# Patient Record
Sex: Female | Born: 1975 | Race: White | Hispanic: No | Marital: Married | Smoking: Never smoker
Health system: Southern US, Community
[De-identification: ages and names within clinical notes are randomized; demographics above are authoritative.]

## PROBLEM LIST (undated history)

## (undated) DIAGNOSIS — M150 Primary generalized (osteo)arthritis: Secondary | ICD-10-CM

## (undated) HISTORY — DX: Primary generalized (osteo)arthritis: M15.0

---

## 2009-12-11 ENCOUNTER — Emergency Department (HOSPITAL_COMMUNITY): Admission: EM | Admit: 2009-12-11 | Discharge: 2009-12-11 | Payer: Self-pay | Admitting: Family Medicine

## 2012-01-29 ENCOUNTER — Other Ambulatory Visit (HOSPITAL_COMMUNITY)
Admission: RE | Admit: 2012-01-29 | Discharge: 2012-01-29 | Disposition: A | Discharge status: Home / Self Care | Payer: Self-pay | Source: Ambulatory Visit | Attending: Family Medicine | Admitting: Family Medicine

## 2012-01-29 DIAGNOSIS — Z01419 Encounter for gynecological examination (general) (routine) without abnormal findings: Secondary | ICD-10-CM | POA: Insufficient documentation

## 2013-02-03 ENCOUNTER — Ambulatory Visit (INDEPENDENT_AMBULATORY_CARE_PROVIDER_SITE_OTHER): Payer: Commercial Managed Care - PPO | Admitting: Physician Assistant

## 2013-02-03 ENCOUNTER — Encounter: Payer: Self-pay | Admitting: Physician Assistant

## 2013-02-03 VITALS — BP 110/70 | HR 78 | Temp 97.3°F | Resp 18 | Ht 63.0 in | Wt 149.0 lb

## 2013-02-03 DIAGNOSIS — M25561 Pain in right knee: Secondary | ICD-10-CM

## 2013-02-03 DIAGNOSIS — M25569 Pain in unspecified knee: Secondary | ICD-10-CM
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
## 2014-01-30 ENCOUNTER — Ambulatory Visit
Admission: RE | Admit: 2014-01-30 | Discharge: 2014-01-30 | Disposition: A | Discharge status: Home / Self Care | Payer: Commercial Managed Care - PPO | Source: Ambulatory Visit | Attending: Family Medicine | Admitting: Family Medicine

## 2014-01-30 ENCOUNTER — Encounter: Payer: Self-pay | Admitting: Family Medicine

## 2014-01-30 ENCOUNTER — Ambulatory Visit (INDEPENDENT_AMBULATORY_CARE_PROVIDER_SITE_OTHER): Payer: Commercial Managed Care - PPO | Admitting: Family Medicine

## 2014-01-30 VITALS — BP 110/68 | HR 80 | Temp 98.5°F | Resp 16 | Ht 64.0 in | Wt 151.0 lb

## 2014-01-30 DIAGNOSIS — M79609 Pain in unspecified limb: Secondary | ICD-10-CM | POA: Diagnosis not present

## 2014-01-30 DIAGNOSIS — M79641 Pain in right hand: Secondary | ICD-10-CM
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
## 2014-02-01 ENCOUNTER — Other Ambulatory Visit: Payer: Self-pay | Admitting: *Deleted

## 2014-02-16 ENCOUNTER — Ambulatory Visit: Payer: Self-pay | Admitting: Family Medicine

## 2015-09-13 ENCOUNTER — Emergency Department (HOSPITAL_COMMUNITY)
Admission: EM | Admit: 2015-09-13 | Discharge: 2015-09-13 | Disposition: A | Discharge status: Home / Self Care | Payer: Commercial Managed Care - PPO | Attending: Emergency Medicine | Admitting: Emergency Medicine

## 2015-09-13 ENCOUNTER — Emergency Department (HOSPITAL_COMMUNITY): Payer: Commercial Managed Care - PPO

## 2015-09-13 ENCOUNTER — Other Ambulatory Visit (HOSPITAL_BASED_OUTPATIENT_CLINIC_OR_DEPARTMENT_OTHER): Payer: Self-pay | Admitting: Emergency Medicine

## 2015-09-13 ENCOUNTER — Encounter (HOSPITAL_COMMUNITY): Payer: Self-pay | Admitting: *Deleted

## 2015-09-13 ENCOUNTER — Ambulatory Visit (HOSPITAL_BASED_OUTPATIENT_CLINIC_OR_DEPARTMENT_OTHER)
Admission: RE | Admit: 2015-09-13 | Discharge: 2015-09-13 | Disposition: A | Discharge status: Home / Self Care | Payer: Commercial Managed Care - PPO | Source: Ambulatory Visit | Attending: Emergency Medicine | Admitting: Emergency Medicine

## 2015-09-13 DIAGNOSIS — Z880 Allergy status to penicillin: Secondary | ICD-10-CM | POA: Diagnosis not present

## 2015-09-13 DIAGNOSIS — M5030 Other cervical disc degeneration, unspecified cervical region: Secondary | ICD-10-CM | POA: Insufficient documentation

## 2015-09-13 DIAGNOSIS — Y9389 Activity, other specified: Secondary | ICD-10-CM | POA: Insufficient documentation

## 2015-09-13 DIAGNOSIS — S161XXA Strain of muscle, fascia and tendon at neck level, initial encounter: Secondary | ICD-10-CM

## 2015-09-13 DIAGNOSIS — X58XXXA Exposure to other specified factors, initial encounter: Secondary | ICD-10-CM | POA: Insufficient documentation

## 2015-09-13 DIAGNOSIS — S199XXA Unspecified injury of neck, initial encounter: Secondary | ICD-10-CM

## 2015-09-13 DIAGNOSIS — Y9375 Activity, martial arts: Secondary | ICD-10-CM

## 2015-09-13 DIAGNOSIS — Y998 Other external cause status: Secondary | ICD-10-CM | POA: Diagnosis not present

## 2015-09-13 DIAGNOSIS — Y9289 Other specified places as the place of occurrence of the external cause: Secondary | ICD-10-CM | POA: Insufficient documentation

## 2015-09-13 DIAGNOSIS — W500XXA Accidental hit or strike by another person, initial encounter: Secondary | ICD-10-CM | POA: Diagnosis not present
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
## 2015-09-13 NOTE — ED Notes (Signed)
Ptient able to ambulate independently
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
## 2015-09-13 NOTE — Discharge Instructions (Signed)
Using take the Vicodin and Valium you were given today at urgent care for pain control, do not drive when taking these, they will make you drowsy would be the equivalent of driving drunk.  Please follow with your primary care doctor in the next 2 days for a check-up. They must obtain records for further management.   Do not hesitate to return to the Emergency Department for any new, worsening or concerning symptoms.    Cervical Sprain A cervical sprain is an injury in the neck in which the strong, fibrous tissues (ligaments) that connect your neck bones stretch or tear. Cervical sprains can range from mild to severe. Severe cervical sprains can cause the neck vertebrae to be unstable. This can lead to damage of the spinal cord and can result in serious nervous system problems. The amount of time it takes for a cervical sprain to get better depends on the cause and extent of the injury. Most cervical sprains heal in 1 to 3 weeks. CAUSES  Severe cervical sprains may be caused by:   Contact sport injuries (such as from football, rugby, wrestling, hockey, auto racing, gymnastics, diving, martial arts, or boxing).   Motor vehicle collisions.   Whiplash injuries. This is an injury from a sudden forward and backward whipping movement of the head and neck.  Falls.  Mild cervical sprains may be caused by:   Being in an awkward position, such as while cradling a telephone between your ear and shoulder.   Sitting in a chair that does not offer proper support.   Working at a poorly Landscape architect station.   Looking up or down for long periods of time.  SYMPTOMS   Pain, soreness, stiffness, or a burning sensation in the front, back, or sides of the neck. This discomfort may develop immediately after the injury or slowly, 24 hours or more after the injury.   Pain or tenderness directly in the middle of the back of the neck.   Shoulder or upper back pain.   Limited ability to move  the neck.   Headache.   Dizziness.   Weakness, numbness, or tingling in the hands or arms.   Muscle spasms.   Difficulty swallowing or chewing.   Tenderness and swelling of the neck.  DIAGNOSIS  Most of the time your health care provider can diagnose a cervical sprain by taking your history and doing a physical exam. Your health care provider will ask about previous neck injuries and any known neck problems, such as arthritis in the neck. X-rays may be taken to find out if there are any other problems, such as with the bones of the neck. Other tests, such as a CT scan or MRI, may also be needed.  TREATMENT  Treatment depends on the severity of the cervical sprain. Mild sprains can be treated with rest, keeping the neck in place (immobilization), and pain medicines. Severe cervical sprains are immediately immobilized. Further treatment is done to help with pain, muscle spasms, and other symptoms and may include:  Medicines, such as pain relievers, numbing medicines, or muscle relaxants.   Physical therapy. This may involve stretching exercises, strengthening exercises, and posture training. Exercises and improved posture can help stabilize the neck, strengthen muscles, and help stop symptoms from returning.  HOME CARE INSTRUCTIONS   Put ice on the injured area.   Put ice in a plastic bag.   Place a towel between your skin and the bag.   Leave the ice on for 15-20  minutes, 3-4 times a day.   If your injury was severe, you may have been given a cervical collar to wear. A cervical collar is a two-piece collar designed to keep your neck from moving while it heals.  Do not remove the collar unless instructed by your health care provider.  If you have long hair, keep it outside of the collar.  Ask your health care provider before making any adjustments to your collar. Minor adjustments may be required over time to improve comfort and reduce pressure on your chin or on the  back of your head.  Ifyou are allowed to remove the collar for cleaning or bathing, follow your health care provider's instructions on how to do so safely.  Keep your collar clean by wiping it with mild soap and water and drying it completely. If the collar you have been given includes removable pads, remove them every 1-2 days and hand wash them with soap and water. Allow them to air dry. They should be completely dry before you wear them in the collar.  If you are allowed to remove the collar for cleaning and bathing, wash and dry the skin of your neck. Check your skin for irritation or sores. If you see any, tell your health care provider.  Do not drive while wearing the collar.   Only take over-the-counter or prescription medicines for pain, discomfort, or fever as directed by your health care provider.   Keep all follow-up appointments as directed by your health care provider.   Keep all physical therapy appointments as directed by your health care provider.   Make any needed adjustments to your workstation to promote good posture.   Avoid positions and activities that make your symptoms worse.   Warm up and stretch before being active to help prevent problems.  SEEK MEDICAL CARE IF:   Your pain is not controlled with medicine.   You are unable to decrease your pain medicine over time as planned.   Your activity level is not improving as expected.  SEEK IMMEDIATE MEDICAL CARE IF:   You develop any bleeding.  You develop stomach upset.  You have signs of an allergic reaction to your medicine.   Your symptoms get worse.   You develop new, unexplained symptoms.   You have numbness, tingling, weakness, or paralysis in any part of your body.  MAKE SURE YOU:   Understand these instructions.  Will watch your condition.  Will get help right away if you are not doing well or get worse.   This information is not intended to replace advice given to you by  your health care provider. Make sure you discuss any questions you have with your health care provider.   Document Released: 06/28/2007 Document Revised: 09/05/2013 Document Reviewed: 03/08/2013 Elsevier Interactive Patient Education [employer].

## 2015-09-13 NOTE — ED Notes (Signed)
Pt continues to be in MRI

## 2015-09-13 NOTE — ED Notes (Signed)
Pt discussed  With dr Eulis Foster  He wants the pt to be seen beofre a mri is ordered.  This information was given to the pt and her husband

## 2015-10-29 ENCOUNTER — Encounter: Payer: Self-pay | Admitting: Family Medicine

## 2015-10-29 ENCOUNTER — Encounter: Payer: Self-pay | Admitting: Physician Assistant

## 2015-10-29 ENCOUNTER — Ambulatory Visit (INDEPENDENT_AMBULATORY_CARE_PROVIDER_SITE_OTHER): Payer: Commercial Managed Care - PPO | Admitting: Family Medicine

## 2015-10-29 VITALS — BP 112/68 | HR 70 | Temp 98.2°F | Resp 14 | Ht 64.0 in | Wt 160.0 lb

## 2015-10-29 DIAGNOSIS — J0110 Acute frontal sinusitis, unspecified: Secondary | ICD-10-CM | POA: Diagnosis not present

## 2015-10-29 DIAGNOSIS — B349 Viral infection, unspecified: Secondary | ICD-10-CM | POA: Diagnosis not present

## 2015-10-29 MED ORDER — GUAIFENESIN-CODEINE 100-10 MG/5ML PO SOLN
5.0000 mL | Freq: Four times a day (QID) | ORAL | Status: DC | PRN
Start: 2015-10-29 — End: 2016-01-27

## 2015-10-29 MED ORDER — AZITHROMYCIN 250 MG PO TABS: ORAL_TABLET | ORAL | Status: DC

## 2015-10-29 NOTE — Progress Notes (Signed)
Patient ID: Carrie Baird, female   DOB: 1975/12/28, 40 y.o.   MRN: [id number]   Subjective:    Patient ID: Carrie Baird, female    DOB: 06/24/1976, 40 y.o.   MRN: [id number]  Patient presents for Illness  patient here with sinus pressure drainage cough sore throat she lost her voice this morning. This has been going on for the past few days. Subjective chills. No fever. No nausea vomiting or GI symptoms. 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 She is on the road and flies a lot she is leaving to fly to another hospital this weekend. Taking OTC allergra, nyquil   Review Of Systems:  GEN- + fatigue, fever, weight loss,weakness, recent illness HEENT- denies eye drainage, change in vision,+ nasal discharge, CVS- denies chest pain, palpitations RESP- denies SOB, +cough, wheeze ABD- denies N/V, change in stools, abd pain GU- denies dysuria, hematuria, dribbling, incontinence MSK- denies joint pain, muscle aches, injury Neuro- + headache, dizziness, syncope, seizure activity       Objective:    BP 112/68 mmHg  Pulse 70  Temp(Src) 98.2 F (36.8 C) (Oral)  Resp 14  Ht 5' 4"  (1.626 m)  Wt 160 lb (72.576 kg)  BMI 27.45 kg/m2  LMP 10/21/2015 (Approximate) GEN- NAD, alert and oriented x3 HEENT- PERRL, EOMI, non injected sclera, pink conjunctiva, MMM, oropharynx mild injection, TM clear bilat no effusion,  + frontal sinus tenderness, inflammed turbinates,  Nasal drainage  Neck- Supple, no LAD CVS- RRR, no murmur RESP-CTAB EXT- No edema Pulses- Radial 2+          Assessment & Plan:      Problem List Items Addressed This Visit    None    Visit Diagnoses    Viral illness    -  Primary    Currently viral illness, given robitussin AC, continue allegra, given zpak if she does not improve by weekend before she flys out    Relevant Medications    azithromycin (ZITHROMAX) 250 MG tablet     Acute frontal sinusitis, recurrence not specified        Relevant Medications    guaiFENesin-codeine 100-10 MG/5ML syrup    azithromycin (ZITHROMAX) 250 MG tablet       Note: This dictation was prepared with Dragon dictation along with smaller phrase technology. Any transcriptional errors that result from this process are unintentional.

## 2015-10-29 NOTE — Patient Instructions (Signed)
Cough medicine for bedtime Start antibiotics if not improved by Friday  Work note 2/14- 11/01/15 - Can return scheduled thereafter  F/u AS NEEDED

## 2016-01-27 ENCOUNTER — Ambulatory Visit (INDEPENDENT_AMBULATORY_CARE_PROVIDER_SITE_OTHER): Payer: Commercial Managed Care - PPO | Admitting: Family Medicine

## 2016-01-27 ENCOUNTER — Encounter: Payer: Self-pay | Admitting: Family Medicine

## 2016-01-27 VITALS — BP 116/62 | HR 70 | Temp 98.5°F | Resp 14 | Ht 64.0 in | Wt 150.0 lb

## 2016-01-27 DIAGNOSIS — L03113 Cellulitis of right upper limb: Secondary | ICD-10-CM

## 2016-01-27 DIAGNOSIS — L02411 Cutaneous abscess of right axilla: Secondary | ICD-10-CM

## 2016-01-27 MED ORDER — SULFAMETHOXAZOLE-TRIMETHOPRIM 800-160 MG PO TABS: 1.0000 | ORAL_TABLET | Freq: Two times a day (BID) | ORAL | Status: DC

## 2016-01-27 MED ORDER — HYDROCODONE-ACETAMINOPHEN 5-325 MG PO TABS: 1.0000 | ORAL_TABLET | Freq: Four times a day (QID) | ORAL | Status: DC | PRN

## 2016-01-27 NOTE — Patient Instructions (Signed)
F/U Wed for recheck  Take antibiotics as pain medications as prescribed Incision and Drainage, Care After Refer to this sheet in the next few weeks. These instructions provide you with information on caring for yourself after your procedure. Your caregiver may also give you more specific instructions. Your treatment has been planned according to current medical practices, but problems sometimes occur. Call your caregiver if you have any problems or questions after your procedure. HOME CARE INSTRUCTIONS   If antibiotic medicine is given, take it as directed. Finish it even if you start to feel better.  Only take over-the-counter or prescription medicines for pain, discomfort, or fever as directed by your caregiver.  Keep all follow-up appointments as directed by your caregiver.  Change any bandages (dressings) as directed by your caregiver. Replace old dressings with clean dressings.  Wash your hands before and after caring for your wound. You will receive specific instructions for cleansing and caring for your wound.  SEEK MEDICAL CARE IF:   You have increased pain, swelling, or redness around the wound.  You have increased drainage, smell, or bleeding from the wound.  You have muscle aches, chills, or you feel generally sick.  You have a fever. MAKE SURE YOU:   Understand these instructions.  Will watch your condition.  Will get help right away if you are not doing well or get worse.   This information is not intended to replace advice given to you by your health care provider. Make sure you discuss any questions you have with your health care provider.   Document Released: 11/23/2011 Document Revised: 09/21/2014 Document Reviewed: 11/23/2011 Elsevier Interactive Patient Education [employer].

## 2016-01-27 NOTE — Progress Notes (Signed)
Patient ID: Carrie Baird, female   DOB: September 12, 1976, 40 y.o.   MRN: [id number]    Subjective:    Patient ID: Carrie Baird, female    DOB: [birth year], 40 y.o.   MRN: [id number]  Patient presents for Abscess to Axilla Patient with abscess to her right axilla she initially thought that she had a spider bite and she was seen in a hotel room when she came home her husband noted it was an ingrown hair he tried to pick it out he was able to squeeze out a small amount of pus but overnight and abscess came up and now she has redness spreading up her arm. She's not had any fever she only has pain at the site. No previous history of any abscess.    Review Of Systems:  GEN- denies fatigue, fever, weight loss,weakness, recent illness HEENT- denies eye drainage, change in vision, nasal discharge, CVS- denies chest pain, palpitations RESP- denies SOB, cough, wheeze ABD- denies N/V, change in stools, abd pain GU- denies dysuria, hematuria, dribbling, incontinence MSK- denies joint pain, muscle aches, injury Neuro- denies headache, dizziness, syncope, seizure activity       Objective:    BP 116/62 mmHg  Pulse 70  Temp(Src) 98.5 F (36.9 C) (Oral)  Resp 14  Ht 5' 4"  (1.626 m)  Wt 150 lb (68.04 kg)  BMI 25.73 kg/m2 GEN- NAD, alert and oriented x3 Skin- RIght axilla, small dime size boil with induration extending 2ichnes of arm, TTP, erythema approx 3 cm down arm, mild warmth   Procedure- Incision and Drainage Procedure explained to patient questions answered benefits and risks discussed verbal consent obtained. Antiseptic-Betadine Anesthesia-lidocaine No Epi  Incision performed teaspoon of pus expressed Culture taken Minimal blood loss Patient tolerated procedure well Bandage applied after 1inch of packing placed        Assessment & Plan:      Problem List Items Addressed This Visit    None    Visit Diagnoses    Abscess of axilla, right    -  Primary    Abscess with early cellulitis  changes, s/p I and D, culture sent, start Bactrim, norco for pain, compresses, recheck 48hours    Relevant Orders    Wound culture    Cellulitis of arm, right           Note: This dictation was prepared with Dragon dictation along with smaller phrase technology. Any transcriptional errors that result from this process are unintentional.

## 2016-01-29 ENCOUNTER — Ambulatory Visit (INDEPENDENT_AMBULATORY_CARE_PROVIDER_SITE_OTHER): Payer: Commercial Managed Care - PPO | Admitting: Family Medicine

## 2016-01-29 ENCOUNTER — Encounter: Payer: Self-pay | Admitting: Family Medicine

## 2016-01-29 VITALS — BP 104/62 | HR 82 | Temp 98.6°F | Resp 12 | Ht 64.0 in | Wt 150.0 lb

## 2016-01-29 DIAGNOSIS — L02411 Cutaneous abscess of right axilla: Secondary | ICD-10-CM

## 2016-01-29 NOTE — Patient Instructions (Signed)
F/U Friday for recheck

## 2016-01-29 NOTE — Progress Notes (Signed)
   Subjective:    Patient ID: Carrie Baird, female    DOB: 03/02/1976, 40 y.o.   MRN: [id number]  Patient presents for F/U Patient had a follow-up right axilla abscess. She also had cellulitis extending to the arm. She was started on Bactrim 48 hours ago after incision and drainage. She's here today she's had continued drainage about quarter size with bandage changes. The packing came out with bandage change last night. She's not had any fever or other systemic symptoms. She uses the pain medicine at night. She did have significant erythema yesterday but is much improved today    Review Of Systems:  GEN- denies fatigue, fever, weight loss,weakness, recent illness MSK- denies joint pain, muscle aches, injury        Objective:    BP 104/62 mmHg  Pulse 82  Temp(Src) 98.6 F (37 C) (Oral)  Resp 12  Ht 5' 4"  (1.626 m)  Wt 150 lb (68.04 kg)  BMI 25.73 kg/m2 GEN- NAD, alert and oriented x3 Skin- previous I and D site open with yellow pus draining, large indurated and flutuant area about 2x2 area with erythema, minimal extension of erythema beyond abscess  Procedure- Incision and Drainage Procedure explained to patient questions answered benefits and risks discussed verbal consent obtained. Antiseptic-Betadine Anesthesia-lidocaine 1% with Epi Previous incision enlarged about 1/2 inch, moderate pus expressed  3 inches of packing placed  Minimal blood loss Patient tolerated procedure well Pressure Bandage applied         Assessment & Plan:      Problem List Items Addressed This Visit    None    Visit Diagnoses    Abscess of right axilla    -  Primary    Staph noted, cultures,pending, opened up a little more due to larger pocket of infection, continue bactrim,recheck Friday If MRSA will give Bactroban to nares x 5 days       Note: This dictation was prepared with Dragon dictation along with smaller phrase technology. Any transcriptional errors that result from this process  are unintentional.

## 2016-01-30 LAB — WOUND CULTURE
GRAM STAIN: NONE SEEN
Gram Stain: NONE SEEN

## 2016-01-31 ENCOUNTER — Other Ambulatory Visit: Payer: Self-pay | Admitting: *Deleted

## 2016-01-31 MED ORDER — MUPIROCIN 2 % EX OINT: 1.0000 "application " | TOPICAL_OINTMENT | Freq: Two times a day (BID) | CUTANEOUS | Status: DC

## 2016-02-03 ENCOUNTER — Ambulatory Visit (INDEPENDENT_AMBULATORY_CARE_PROVIDER_SITE_OTHER): Payer: Commercial Managed Care - PPO | Admitting: Family Medicine

## 2016-02-03 ENCOUNTER — Encounter: Payer: Self-pay | Admitting: Family Medicine

## 2016-02-03 VITALS — BP 110/64 | HR 62 | Temp 97.9°F | Resp 14 | Ht 64.0 in | Wt 150.0 lb

## 2016-02-03 DIAGNOSIS — L02411 Cutaneous abscess of right axilla: Secondary | ICD-10-CM

## 2016-02-03 DIAGNOSIS — Z22322 Carrier or suspected carrier of Methicillin resistant Staphylococcus aureus: Secondary | ICD-10-CM

## 2016-02-03 MED ORDER — SULFAMETHOXAZOLE-TRIMETHOPRIM 800-160 MG PO TABS: 1.0000 | ORAL_TABLET | Freq: Two times a day (BID) | ORAL | Status: DC

## 2016-02-03 NOTE — Progress Notes (Signed)
Patient ID: Carrie Baird, female   DOB: 1976/05/09, 40 y.o.   MRN: [id number]    Subjective:    Patient ID: Carrie Baird, female    DOB: 30-Aug-1976, 40 y.o.   MRN: [id number]  Patient presents for F/U Abscess  Patient for interim follow-up on right axilla abscess she no longer has any drainage or packing came out 2 days ago. She completed her last antibiotic this morning which will be a seven-day course. She also is using Bactroban per nares that she had MRSA in her culture  She has a tick bite on her left hip region no itching no drainage on Sat  Night , she pulled off in entirety    Review Of Systems:  GEN- denies fatigue, fever, weight loss,weakness, recent illness HEENT- denies eye drainage, change in vision, nasal discharge, CVS- denies chest pain, palpitations RESP- denies SOB, cough, wheeze ABD- denies N/V, change in stools, abd pain GU- denies dysuria, hematuria, dribbling, incontinence MSK- denies joint pain, muscle aches, injury Neuro- denies headache, dizziness, syncope, seizure activity       Objective:    BP 110/64 mmHg  Pulse 62  Temp(Src) 97.9 F (36.6 C) (Oral)  Resp 14  Ht 5' 4"  (1.626 m)  Wt 150 lb (68.04 kg)  BMI 25.73 kg/m2 GEN- NAD, alert and oriented x3 Skin- Left hip- scab previous tick bite with mild erythema  Right axilla no erythema, previous I and D site still with 2cm of induration, no fluctance, minimal erythema         Assessment & Plan:      Problem List Items Addressed This Visit    None    Visit Diagnoses    Abscess of right axilla    -  Primary    Will extend antibiotic to 10 days with MRSA, complete bactroban. tick bite looks okay, advised to call back for any changes    MRSA (methicillin resistant staph aureus) culture positive           Note: This dictation was prepared with Dragon dictation along with smaller phrase technology. Any transcriptional errors that result from this process are unintentional.

## 2016-02-03 NOTE — Patient Instructions (Signed)
Take 3 more days of antibiotics Finish bactroban F/U as needed

## 2016-02-24 ENCOUNTER — Telehealth: Payer: Self-pay | Admitting: Physician Assistant

## 2016-02-24 NOTE — Telephone Encounter (Signed)
Patient calling to see if dr Buelah Manis can write her a letter saying she is healthy enough to compete in a martial arts tournament  [phone] (H)

## 2016-02-24 NOTE — Telephone Encounter (Signed)
Call placed to patient to inquire.   [place].

## 2016-02-25 NOTE — Telephone Encounter (Signed)
Call placed to patient. [place].

## 2016-02-26 NOTE — Telephone Encounter (Signed)
Multiple calls placed to patient with no answer and no return call.   Message to be closed.

## 2016-03-06 ENCOUNTER — Encounter: Payer: Self-pay | Admitting: *Deleted

## 2016-03-06 NOTE — Telephone Encounter (Signed)
Patient returned call.   States that she has a martial arts competition in [place]. The regulations have changed and she requires a letter stating that she is physically able to compete.   "[place], MARTIAL ARTS AND PROFESSIONAL WRESTLING ADVISORY BOARD- PROFESSIONAL BOXING AND WRESTLING EVENTS REGULATIONS [id number]. Entry requirements for boxer and martial artist: 4. Submit a certification from a licensed physician within the past six months certifying that the applicant is in good physical health and that the physician has not observed any abnormalities or deficiencies that would prevent the applicant from participation in a boxing or martial arts event or endanger the applicant, the public, officials, or other licensees participating in the event. The department may require additional medical tests to determine the fitness of a boxer or martial artist upon receipt of reliable information of a preexisting condition that may present a danger to the boxer or martial artist."  MD please advise.

## 2016-03-06 NOTE — Telephone Encounter (Signed)
Letter transcribed.   Call placed to patient and patient made aware to pick up letter after 2pm.

## 2016-03-06 NOTE — Telephone Encounter (Signed)
Okay to write letter, she is able to compete

## 2016-06-05 ENCOUNTER — Ambulatory Visit (INDEPENDENT_AMBULATORY_CARE_PROVIDER_SITE_OTHER): Payer: Commercial Managed Care - PPO | Admitting: Family Medicine

## 2016-06-05 ENCOUNTER — Encounter: Payer: Self-pay | Admitting: Family Medicine

## 2016-06-05 VITALS — BP 118/66 | HR 72 | Temp 98.7°F | Resp 12 | Ht 64.0 in | Wt 154.0 lb

## 2016-06-05 DIAGNOSIS — R253 Fasciculation: Secondary | ICD-10-CM

## 2016-06-05 DIAGNOSIS — G2581 Restless legs syndrome: Secondary | ICD-10-CM | POA: Diagnosis not present

## 2016-06-05 LAB — CBC WITH DIFFERENTIAL/PLATELET
BASOS ABS: 0 {cells}/uL (ref 0–200)
Basophils Relative: 0 %
EOS ABS: 69 {cells}/uL (ref 15–500)
Eosinophils Relative: 1 %
HCT: 39.6 % (ref 35.0–45.0)
Hemoglobin: 13.2 g/dL (ref 12.0–15.0)
LYMPHS PCT: 29 %
Lymphs Abs: 2001 cells/uL (ref 850–3900)
MCH: 28 pg (ref 27.0–33.0)
MCHC: 33.3 g/dL (ref 32.0–36.0)
MCV: 83.9 fL (ref 80.0–100.0)
MONOS PCT: 7 %
MPV: 9.5 fL (ref 7.5–12.5)
Monocytes Absolute: 483 cells/uL (ref 200–950)
Neutro Abs: 4347 cells/uL (ref 1500–7800)
Neutrophils Relative %: 63 %
PLATELETS: 234 10*3/uL (ref 140–400)
RBC: 4.72 MIL/uL (ref 3.80–5.10)
RDW: 13 % (ref 11.0–15.0)
WBC: 6.9 10*3/uL (ref 3.8–10.8)

## 2016-06-05 LAB — TSH: TSH: 3.95 m[IU]/L

## 2016-06-05 NOTE — Progress Notes (Signed)
   Subjective:    Patient ID: Carrie Baird, female    DOB: Sep 20, 1975, 40 y.o.   MRN: [id number]  Patient presents for Restless Leg (states that she is having difficulty sleeping at night due to RLS- starting to impact daily activities) Patient here with restless legs this is been going on for years but worsening over the past few months. She initially thought was due to her overtraining but she is actually stopped training for the past few months it has been no change in her leg symptoms. Typically happens in the evening her muscles lastly fasciculation showed me a video of this her legs within job around at bedtime. She has tried increasing iron in her diet cutting out caffeine as well no improvement. She denies any pain during the day no difficulty with walking she occasionally gets spasms in her lower back.    Review Of Systems:  GEN- denies fatigue, fever, weight loss,weakness, recent illness HEENT- denies eye drainage, change in vision, nasal discharge, CVS- denies chest pain, palpitations RESP- denies SOB, cough, wheeze ABD- denies N/V, change in stools, abd pain GU- denies dysuria, hematuria, dribbling, incontinence MSK- denies joint pain, muscle aches, injury Neuro- denies headache, dizziness, syncope, seizure activity       Objective:    BP 118/66 (BP Location: Left Arm, Patient Position: Sitting, Cuff Size: Normal)   Pulse 72   Temp 98.7 F (37.1 C) (Oral)   Resp 12   Ht 5' 4"  (1.626 m)   Wt 154 lb (69.9 kg)   LMP 05/05/2016 (Approximate) Comment: regular  BMI 26.43 kg/m  GEN- NAD, alert and oriented x3 CVS- RRR, no murmur RESP-CTAB EXT- No edema, muscles NT lower ext to palpation MSK- FROM Back, knees, ankles Pulses- Radial, DP- 2+        Assessment & Plan:      Problem List Items Addressed This Visit    None    Visit Diagnoses    Muscle fasciculation    -  Primary   Noted on video also with RLS in general, 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  Check labs, if normal try Requip 0.49m at bedtime   Relevant Orders   CBC with Differential/Platelet   Comprehensive metabolic panel   CK   RLS (restless legs syndrome)       Relevant Orders   CBC with Differential/Platelet   TSH   Ferritin   Iron      Note: This dictation was prepared with Dragon dictation along with smaller phrase technology. Any transcriptional errors that result from this process are unintentional.

## 2016-06-05 NOTE — Patient Instructions (Signed)
We will call with lab results F/U pending results

## 2016-06-06 LAB — COMPREHENSIVE METABOLIC PANEL
ALT: 10 U/L (ref 6–29)
AST: 17 U/L (ref 10–30)
Albumin: 3.9 g/dL (ref 3.6–5.1)
Alkaline Phosphatase: 55 U/L (ref 33–115)
BUN: 12 mg/dL (ref 7–25)
CHLORIDE: 104 mmol/L (ref 98–110)
CO2: 24 mmol/L (ref 20–31)
Calcium: 9.2 mg/dL (ref 8.6–10.2)
Creat: 0.98 mg/dL (ref 0.50–1.10)
GLUCOSE: 85 mg/dL (ref 70–99)
POTASSIUM: 5.3 mmol/L (ref 3.5–5.3)
Sodium: 139 mmol/L (ref 135–146)
Total Bilirubin: 0.5 mg/dL (ref 0.2–1.2)
Total Protein: 6.6 g/dL (ref 6.1–8.1)

## 2016-06-06 LAB — CK: CK TOTAL: 91 U/L (ref 7–177)

## 2016-06-06 LAB — FERRITIN: Ferritin: 33 ng/mL (ref 10–232)

## 2016-06-06 LAB — IRON: Iron: 156 ug/dL (ref 40–190)

## 2016-06-12 ENCOUNTER — Other Ambulatory Visit: Payer: Self-pay | Admitting: *Deleted

## 2016-06-12 MED ORDER — ROPINIROLE HCL 0.25 MG PO TABS: ORAL_TABLET | ORAL | 3 refills | Status: DC

## 2017-04-09 ENCOUNTER — Encounter: Payer: Self-pay | Admitting: Family Medicine

## 2017-04-09 ENCOUNTER — Ambulatory Visit (INDEPENDENT_AMBULATORY_CARE_PROVIDER_SITE_OTHER): Payer: Commercial Managed Care - PPO | Admitting: Family Medicine

## 2017-04-09 VITALS — BP 110/68 | HR 76 | Temp 98.7°F | Resp 14 | Ht 64.0 in | Wt 168.0 lb

## 2017-04-09 DIAGNOSIS — J0110 Acute frontal sinusitis, unspecified: Secondary | ICD-10-CM

## 2017-04-09 MED ORDER — PREDNISONE 20 MG PO TABS: ORAL_TABLET | ORAL | 0 refills | Status: DC

## 2017-04-09 MED ORDER — AZITHROMYCIN 250 MG PO TABS: ORAL_TABLET | ORAL | 0 refills | Status: DC

## 2017-04-09 NOTE — Progress Notes (Signed)
Subjective:    Patient ID: Carrie Baird, female    DOB: 04/01/1976, 41 y.o.   MRN: [id number]  HPI Symptoms began more than 3 weeks ago. Symptoms began with a sinus infection. She had pain and pressure in her frontal sinuses, postnasal drip, rhinorrhea, cough productive of yellow and clear mucus. Sinus pressure has improved. She is no longer having any sinus pain. She has constant rhinorrhea, constant postnasal drip. Every morning she's coughing up thick mucus. She reports a persistent cough that will not go away. She denies any fevers or chills. She denies any shortness of breath. She denies any hemoptysis. She denies any purulent sputum. She does report postnasal drainage irritating her throat causing the cough she is tried Allegra-D as well as other decongestants over-the-counter with minimal relief No past medical history on file. No past surgical history on file. Current Outpatient Prescriptions on File Prior to Visit  Medication Sig Dispense Refill  . fexofenadine-pseudoephedrine (ALLEGRA-D 24) 180-240 MG 24 hr tablet Take 1 tablet by mouth daily as needed (allergy).     No current facility-administered medications on file prior to visit.    Allergies  Allergen Reactions  . Aspirin Other (See Comments)    MAKES  NOSE BLEED  . Penicillins Rash    Has patient had a PCN reaction causing immediate rash, facial/tongue/throat swelling, SOB or lightheadedness with hypotension: No Has patient had a PCN reaction causing severe rash involving mucus membranes or skin necrosis: No Has patient had a PCN reaction that required hospitalization No Has patient had a PCN reaction occurring within the last 10 years: No If all of the above answers are "NO", then may proceed with Cephalosporin use.   Social History   Social History  . Marital status: Married    Spouse name: N/A  . Number of children: N/A  . Years of education: N/A   Occupational History  . Not on file.   Social History Main  Topics  . Smoking status: Never Smoker  . Smokeless tobacco: Never Used  . Alcohol use Yes  . Drug use: No  . Sexual activity: Yes   Other Topics Concern  . Not on file   Social History Narrative  . No narrative on file      Review of Systems  All other systems reviewed and are negative.      Objective:   Physical Exam  Constitutional: She appears well-developed and well-nourished. No distress.  HENT:  Right Ear: External ear normal.  Left Ear: External ear normal.  Nose: Mucosal edema and rhinorrhea present. Right sinus exhibits no maxillary sinus tenderness and no frontal sinus tenderness. Left sinus exhibits no maxillary sinus tenderness and no frontal sinus tenderness.  Mouth/Throat: Oropharynx is clear and moist. No oropharyngeal exudate.  Eyes: Conjunctivae are normal.  Neck: Neck supple.  Cardiovascular: Normal rate, regular rhythm and normal heart sounds.   Pulmonary/Chest: Effort normal and breath sounds normal. No respiratory distress. She has no wheezes. She has no rales.  Lymphadenopathy:    She has no cervical adenopathy.  Skin: She is not diaphoretic.          Assessment & Plan:  Acute frontal sinusitis, recurrence not specified - Plan: predniSONE (DELTASONE) 20 MG tablet, azithromycin (ZITHROMAX) 250 MG tablet  I believe the patient has sinusitis but rather than infectious, I believe this is more of an irritant/allergic sinusitis. I will prescribe the patient a prednisone taper pack. I believe we can, postnasal drip, sinus irritation, and rhinorrhea,  the cough will improve. I see no indication today of an infection on her exam. She she develops sinus pain, sinus pressure, fever, or worsening headache, I did give the patient prescription for a Z-Pak to get at that time. However I counseled her not to get the prescription yet as I do not believe this is bacterial but rather allergic.

## 2017-04-29 IMAGING — MR MR CERVICAL SPINE W/O CM
4 of 5 series · 18 of 48 positions shown · non-contrast
Comparison: CT cervical spine September 13, 2015 at 1799 hours

CLINICAL DATA: Jujitsu training injury resulting in RIGHT-sided
neck pain and tremor.

EXAM:
MRI CERVICAL SPINE WITHOUT CONTRAST
TECHNIQUE: Multiplanar, multisequence MR imaging of the cervical spine was
performed. No intravenous contrast was administered.

[Series 3: T2 · sagittal · 3.0mm · 0.47mm/px · 7 of 14 slices shown (1 of 2)]
[im 1/14]
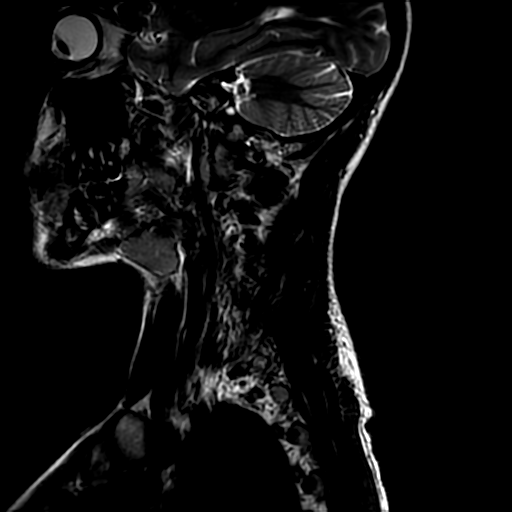
[im 3/14]
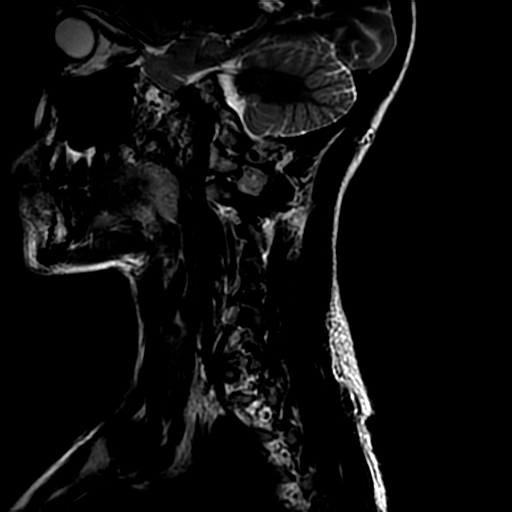
[im 5/14]
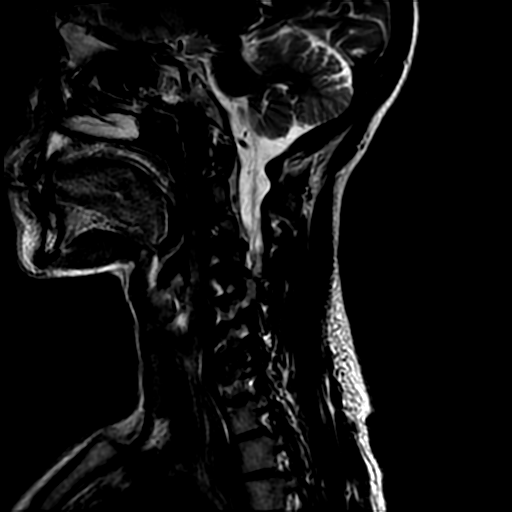
[im 7/14]
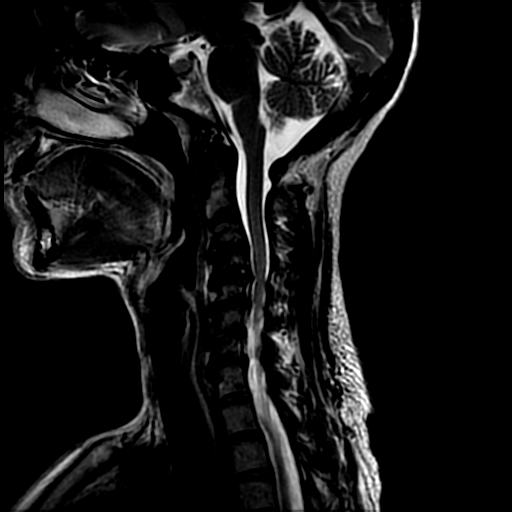
[im 9/14]
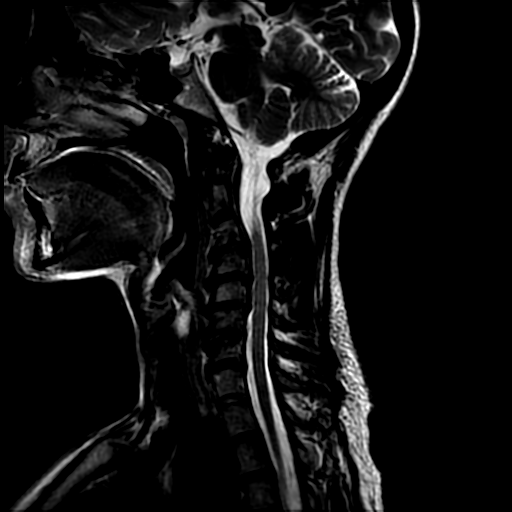
[im 11/14]
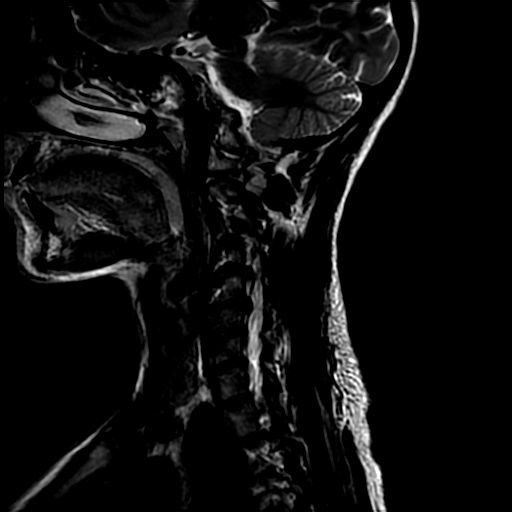
[im 14/14]
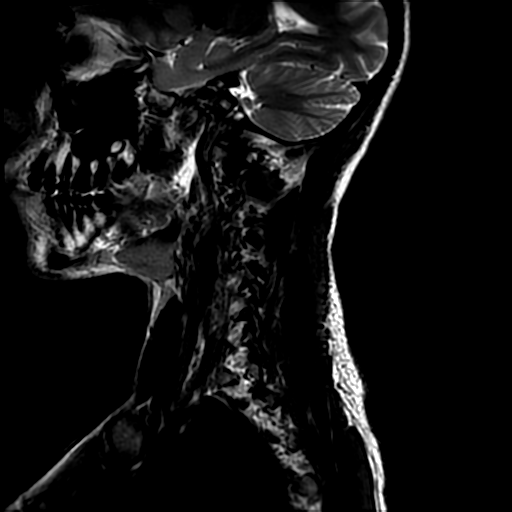

[Series 4: STIR · sagittal · 3.0mm · 0.47mm/px · 3 of 14 slices shown]
[im 3/14]
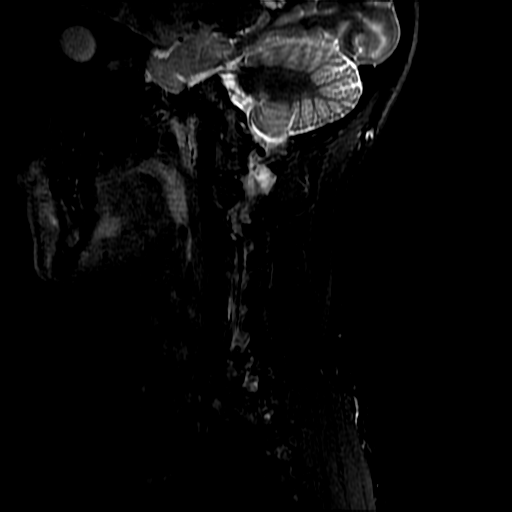
[im 8/14]
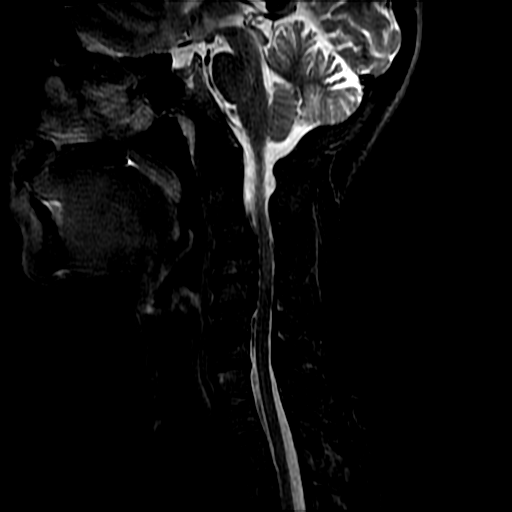
[im 14/14]
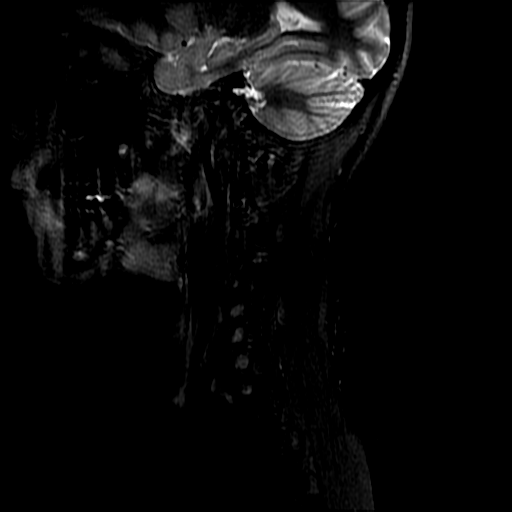

[Series 5: FLAIR · sagittal · 3.0mm · 0.47mm/px · 3 of 14 slices shown]
[im 3/14]
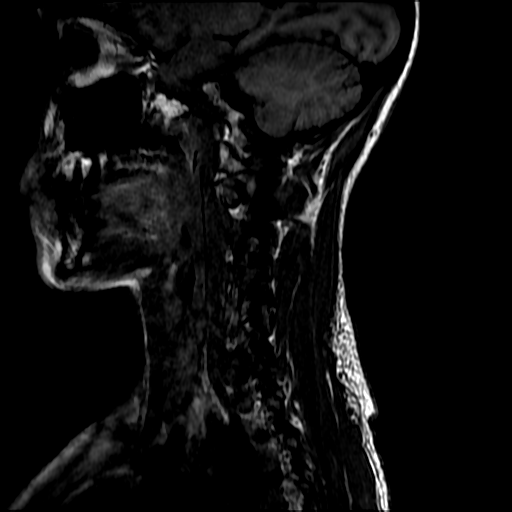
[im 8/14]
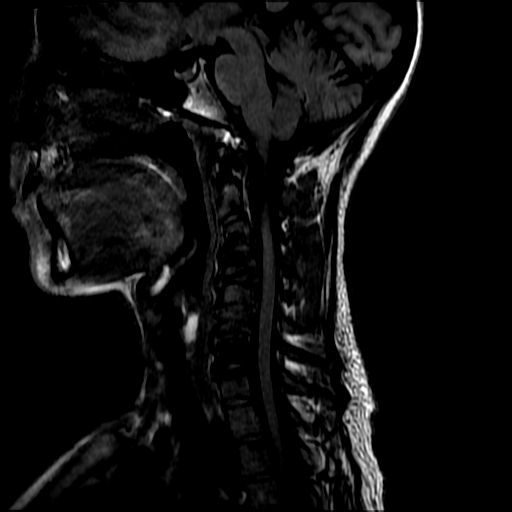
[im 14/14]
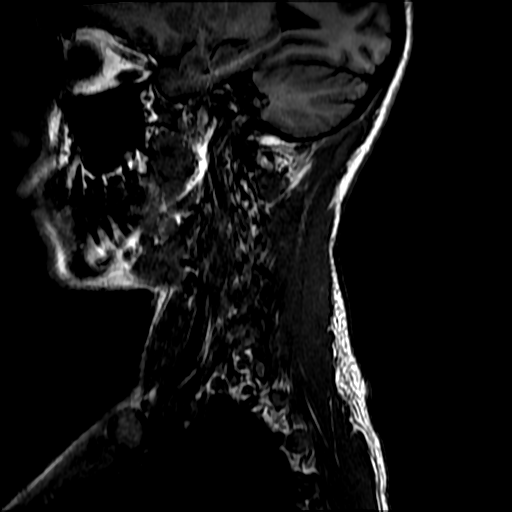

[Series 7: T2 · axial · 3.0mm · 0.47mm/px · z∈[-121,-48]mm · 5 of 29 slices shown (2 of 2)]
[im 1/29]
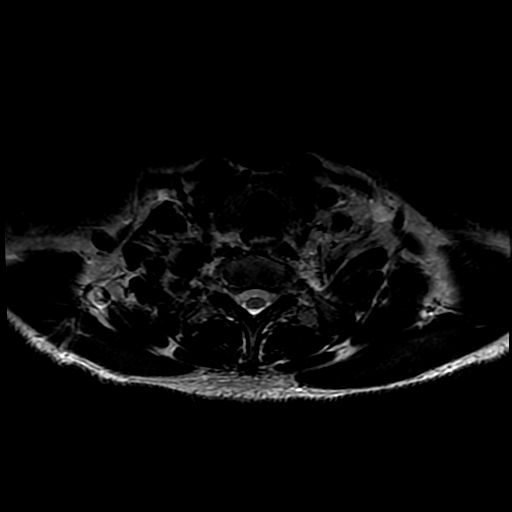
[im 5/29]
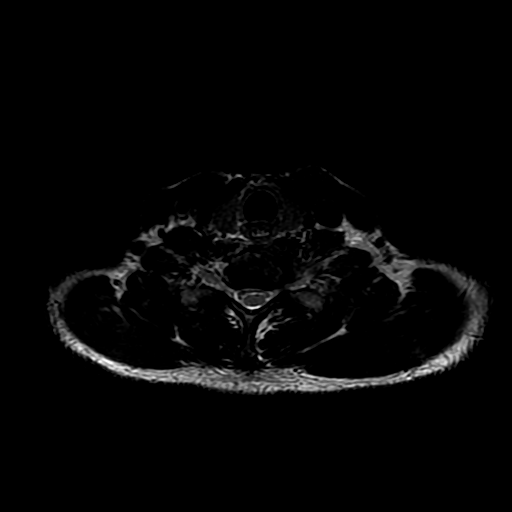
[im 10/29]
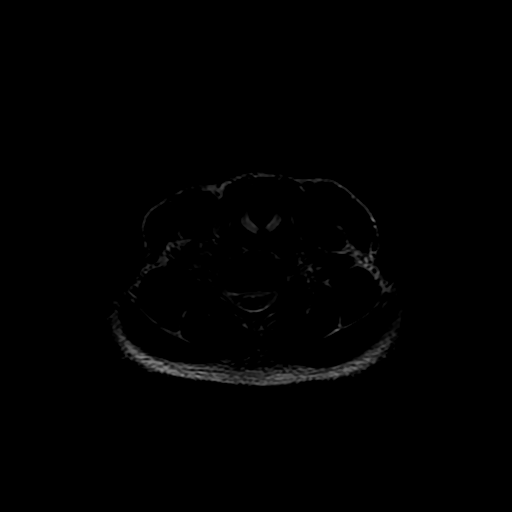
[im 15/29]
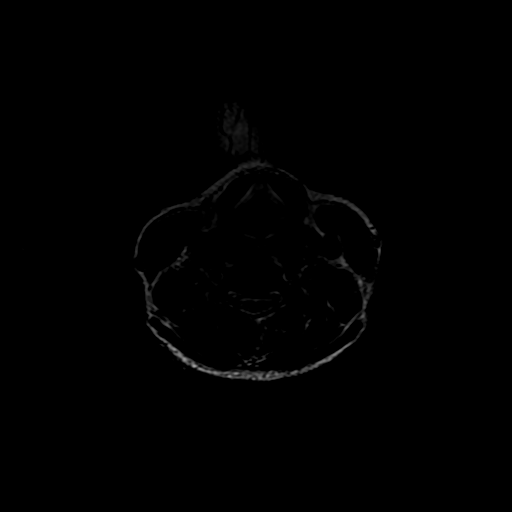
[im 24/29]
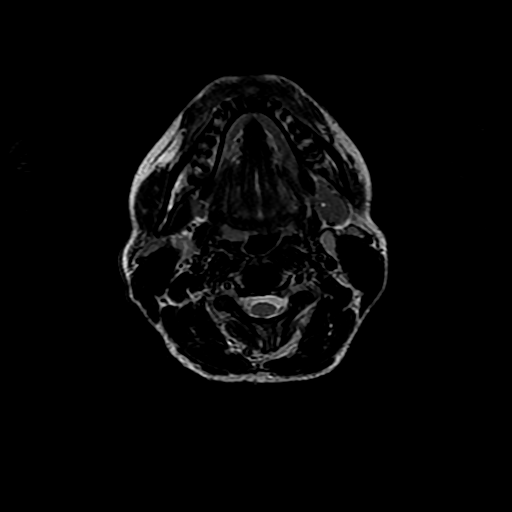

[18 of 48 positions shown; findings below may reference images not displayed]

FINDINGS: Mild motion degraded examination. Cervical vertebral bodies appear
intact. Broad reversed cervical lordosis. Patchy low T1 signal
within the C3 through T1 vertebral bodies corresponding to sclerosis
on today's CT. Mild acute discogenic endplate change at C7-T1.
Moderate to severe chronic discogenic endplate changes and height
loss C3-4 thru C6-7, moderate C7-T1. No STIR signal abnormality to
suggest cervical spine fracture. Mild linear low T1 and bright STIR
signal within T1 superior endplate without height loss.

Cervical spinal cord appears normal morphology and signal
characteristics from the cervical medullary junction to level of
T3-4, the most caudal well visualized level. Craniocervical junction
intact. No MR findings of ligamentous injury. Included prevertebral
and paraspinal soft tissues are normal.

Level by level evaluation:

C2-3: No disc bulge, canal stenosis nor neural foraminal narrowing.

C3-4:RIGHT central small disc protrusion, uncovertebral hypertrophy.
Mild canal stenosis. Mild LEFT neural foraminal narrowing.

C4-5: Small broad-based disc bulge, uncovertebral hypertrophy. Mild
to moderate canal stenosis. Moderate LEFT greater than RIGHT neural
foraminal narrowing.

C5-6: Small broad-based disc bulge, uncovertebral hypertrophy and
facet arthropathy result in mild to moderate canal stenosis. Mild to
moderate bilateral neural foraminal narrowing.

C6-7: Small broad-based disc bulge, uncovertebral hypertrophy. No
canal stenosis. Mild to moderate RIGHT neural foraminal narrowing.

C7-T1: Small broad-based disc bulge asymmetric to LEFT.
Uncovertebral hypertrophy. No canal stenosis. Moderate LEFT neural
foraminal narrowing.
IMPRESSION: No acute cervical spine fracture nor malalignment. No MR finding of
ligamentous injury.

Mild T1 superior endplate edema, unclear this represents acute
discogenic endplate changes or, nondisplaced compression.

Advanced degenerative changes cervical spine resulting in mild to
moderate canal stenosis C4-5 and C5-6, mild at C3-4.

Neural foraminal narrowing C3-4 thru C7-T1: Moderate at C4-5 and
C7-T1.

## 2018-05-19 ENCOUNTER — Ambulatory Visit: Payer: Commercial Managed Care - PPO | Admitting: Physician Assistant

## 2018-05-24 ENCOUNTER — Encounter: Payer: Self-pay | Admitting: Physician Assistant

## 2018-11-18 ENCOUNTER — Encounter: Payer: Self-pay | Admitting: Family Medicine

## 2018-11-18 ENCOUNTER — Ambulatory Visit (INDEPENDENT_AMBULATORY_CARE_PROVIDER_SITE_OTHER): Payer: Managed Care, Other (non HMO) | Admitting: Family Medicine

## 2018-11-18 VITALS — BP 120/82 | HR 60 | Temp 98.4°F | Resp 15 | Ht 64.0 in | Wt 172.2 lb

## 2018-11-18 DIAGNOSIS — M79642 Pain in left hand: Secondary | ICD-10-CM

## 2018-11-18 DIAGNOSIS — R29898 Other symptoms and signs involving the musculoskeletal system: Secondary | ICD-10-CM | POA: Diagnosis not present

## 2018-11-18 DIAGNOSIS — S8391XA Sprain of unspecified site of right knee, initial encounter: Secondary | ICD-10-CM

## 2018-11-18 DIAGNOSIS — M79641 Pain in right hand: Secondary | ICD-10-CM | POA: Diagnosis not present

## 2018-11-18 NOTE — Progress Notes (Signed)
Patient ID: Carrie Baird, female    DOB: [birth year], 43 y.o.   MRN: [id number]  PCP: Delsa Grana, PA-C  Chief Complaint  Patient presents with  . Joint Pain    Patient has c/o bilateral hand pain, and right knee pain. Onset a few weeks ago    Subjective:   Carrie Baird is a 43 y.o. female, presents to clinic with CC of bilateral hand pain and right knee pain secondary to injury.  She is right-hand dominant female, she complains of gradual onset of bilateral left hand pain and developing weakness in both hands with gripping.  Symptoms have been much more severe and gradually worsening for 1 month but she states that her hand and knuckle symptoms did start a long time ago.  Pain is worse in both hands bilaterally to the second and third MCP joint, left hand worse than right.  Pain is mild described as an ache, has become more constant and gradually more severe.  Becomes acutely worse to her left hand with grabbing and twisting, pain becomes sharp shooting pain.    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  She also does a lot of recreational fighting/boxing/MMA style "combat" and has for years and has been dealing with years of gradual joint pain and swelling. She had acute right knee pain after falling during a match.  Her left knee was kicked and hyper extended causing her to fall onto her right side she states that the impact hit the outside of her right knee.  For about a month she has had pain to the outside of her right knee that has been intermittent and gradually worsening.  She was able to walk initially has had several instances where pain is much more severe associated with sensation of instability.  She has been wearing a brace to do her training and boxing and this does help her feel more secure and does improve the pain.  She has also applied ice and has been taking NSAIDs and Tylenol.  She has not been resting her hands or her knee very much.  She denies any swelling  bruising or redness to her right knee.  Some days she has no right knee pain and other times without any specific movements, activity or precipitating factors she will have sudden onset, severe right knee pain located to the outside of her knee is nonradiating but causes her to have difficulty bearing weight.    There are no active problems to display for this patient.    Prior to Admission medications   Medication Sig Start Date End Date Taking? Authorizing Provider  fexofenadine-pseudoephedrine (ALLEGRA-D 24) 180-240 MG 24 hr tablet Take 1 tablet by mouth daily as needed (allergy).   Yes [provider]     Allergies  Allergen Reactions  . Aspirin Other (See Comments)    MAKES  NOSE BLEED  . Penicillins Rash    Has patient had a PCN reaction causing immediate rash, facial/tongue/throat swelling, SOB or lightheadedness with hypotension: No Has patient had a PCN reaction causing severe rash involving mucus membranes or skin necrosis: No Has patient had a PCN reaction that required hospitalization No Has patient had a PCN reaction occurring within the last 10 years: No If all of the above answers are "NO", then may proceed with Cephalosporin use.     No family history on file.   Social History   Socioeconomic History  . Marital status:  Married    Spouse name: Not on file  . Number of children: Not on file  . Years of education: Not on file  . Highest education level: Not on file  Occupational History  . Not on file  Social Needs  . Financial resource strain: Not on file  . Food insecurity:    Worry: Not on file    Inability: Not on file  . Transportation needs:    Medical: Not on file    Non-medical: Not on file  Tobacco Use  . Smoking status: Never Smoker  . Smokeless tobacco: Never Used  Substance and Sexual Activity  . Alcohol use: Yes  . Drug use: No  . Sexual activity: Yes  Lifestyle  . Physical activity:    Days per week: Not on file    Minutes  per session: Not on file  . Stress: Not on file  Relationships  . Social connections:    Talks on phone: Not on file    Gets together: Not on file    Attends religious service: Not on file    Active member of club or organization: Not on file    Attends meetings of clubs or organizations: Not on file    Relationship status: Not on file  . Intimate partner violence:    Fear of current or ex partner: Not on file    Emotionally abused: Not on file    Physically abused: Not on file    Forced sexual activity: Not on file  Other Topics Concern  . Not on file  Social History Narrative  . Not on file     Review of Systems  Constitutional: Negative.   HENT: Negative.   Eyes: Negative.   Respiratory: Negative.   Cardiovascular: Negative.   Gastrointestinal: Negative.   Endocrine: Negative.   Genitourinary: Negative.   Musculoskeletal: Negative.   Skin: Negative.   Allergic/Immunologic: Negative.   Neurological: Negative.   Hematological: Negative.   Psychiatric/Behavioral: Negative.   All other systems reviewed and are negative.      Objective:    Vitals:   11/18/18 1150  BP: 120/82  Pulse: 60  Resp: 15  Temp: 98.4 F (36.9 C)  TempSrc: Oral  SpO2: 99%  Weight: 172 lb 4 oz (78.1 kg)  Height: 5' 4"  (1.626 m)      Physical Exam Vitals signs and nursing note reviewed.  Constitutional:      Appearance: She is well-developed.  HENT:     Head: Normocephalic and atraumatic.     Nose: Nose normal.  Eyes:     General:        Right eye: No discharge.        Left eye: No discharge.     Conjunctiva/sclera: Conjunctivae normal.  Neck:     Trachea: No tracheal deviation.  Cardiovascular:     Rate and Rhythm: Normal rate and regular rhythm.  Pulmonary:     Effort: Pulmonary effort is normal. No respiratory distress.     Breath sounds: No stridor.  Musculoskeletal: Normal range of motion.     Right knee: She exhibits normal range of motion, no swelling, no  effusion, no ecchymosis, no deformity, no laceration, no erythema, normal alignment and normal patellar mobility. Tenderness found. Lateral joint line and LCL tenderness noted. No medial joint line, no MCL and no patellar tendon tenderness noted.     Comments: Right knee pain with varus stress and anterior drawer test. No ttp to popliteal fossa  Skin:    General: Skin is warm and dry.     Findings: No rash.  Neurological:     Mental Status: She is alert.     Motor: No abnormal muscle tone.     Coordination: Coordination normal.  Psychiatric:        Behavior: Behavior normal.           Assessment & Plan:      ICD-10-CM   1. Sprain of right knee, unspecified ligament, initial encounter S83.91XA Ambulatory referral to Orthopedic Surgery  2. Left hand pain M79.642 Ambulatory referral to Hand Surgery  3. Left hand weakness R29.898 Ambulatory referral to Hand Surgery  4. Right hand pain M79.641 Ambulatory referral to Hand Surgery    Right knee suspect LCL ligament injury and possibly some ACL? Or meniscal?  Pain with anterior drawer but no laxity.  Brace, NSAIDs, rest, ice - f/up ortho  B/L hands possibly arthritis or inflammation with repeated trauma/contusions - may do well with hand eval or sports medicine eval to help assess and advise how to protect with continued boxing/fighting etc.  Ice, rest, NSAIDs f/up hand specialist?    Delsa Grana, PA-C 11/18/18 12:21 PM

## 2018-11-18 NOTE — Patient Instructions (Addendum)
Naproxen 500 twice a day Aleve 220 twice a day Ibuprofen 600-800 three times a day  Pick one  Tylenol [phone] 3-4 x a day max 4000 mg    RICE Therapy for Routine Care of Injuries Many injuries can be cared for with rest, ice, compression, and elevation (RICE therapy). This includes:  Resting the injured part.  Putting ice on the injury.  Putting pressure (compression) on the injury.  Raising the injured part (elevation). Using RICE therapy can help to lessen pain and swelling. Supplies needed:  Ice.  Plastic bag.  Towel.  Elastic bandage.  Pillow or pillows to raise (elevate) your injured body part. How to care for your injury with RICE therapy Rest Limit your normal activities, and try not to use the injured part of your body. You can go back to your normal activities when your doctor says it is okay to do them and you feel okay. Ask your doctor if you should do exercises to help your injury get better. Ice Put ice on the injured area. Do not put ice on your bare skin.  Put ice in a plastic bag.  Place a towel between your skin and the bag.  Leave the ice on for 20 minutes, 2-3 times a day. Use ice on as many days as told by your doctor.  Compression Compression means putting pressure on the injured area. This can be done with an elastic bandage. If an elastic bandage has been put on your injury:  Do not wrap the bandage too tight. Wrap the bandage more loosely if part of your body away from the bandage is blue, swollen, cold, painful, or loses feeling (gets numb).  Take off the bandage and put it on again. Do this every 3-4 hours or as told by your doctor.  See your doctor if the bandage seems to make your problems worse.  Elevation Elevation means keeping the injured area raised. If you can, raise the injured area above your heart or the center of your chest. Contact a doctor if:  You keep having pain and swelling.  Your symptoms get worse. Get help  right away if:  You have sudden bad pain at your injury or lower than your injury.  You have redness or more swelling around your injury.  You have tingling or numbness at your injury or lower than your injury, and it does not go away when you take off the bandage. Summary  Many injuries can be cared for using rest, ice, compression, and elevation (RICE therapy).  You can go back to your normal activities when you feel okay and your doctor says it is okay.  Put ice on the injured area as told by your doctor.  Get help if your symptoms get worse or if you keep having pain and swelling. This information is not intended to replace advice given to you by your health care provider. Make sure you discuss any questions you have with your health care provider. Document Released: 02/17/2008 Document Revised: 05/21/2017 Document Reviewed: 05/21/2017 Elsevier Interactive Patient Education  2019 [employer].

## 2018-11-22 ENCOUNTER — Encounter: Payer: Self-pay | Admitting: Family Medicine

## 2020-11-19 HISTORY — PX: KNEE SURGERY: SHX244

## 2021-03-04 ENCOUNTER — Encounter: Payer: Managed Care, Other (non HMO) | Admitting: Nurse Practitioner

## 2021-08-11 ENCOUNTER — Other Ambulatory Visit: Payer: Self-pay

## 2021-08-11 ENCOUNTER — Encounter: Payer: Self-pay | Admitting: Emergency Medicine

## 2021-08-11 ENCOUNTER — Ambulatory Visit
Admission: EM | Admit: 2021-08-11 | Discharge: 2021-08-11 | Disposition: A | Discharge status: Home / Self Care | Payer: 59 | Attending: Family Medicine | Admitting: Family Medicine

## 2021-08-11 DIAGNOSIS — J22 Unspecified acute lower respiratory infection: Secondary | ICD-10-CM | POA: Diagnosis not present

## 2021-08-11 DIAGNOSIS — J3089 Other allergic rhinitis: Secondary | ICD-10-CM

## 2021-08-11 MED ORDER — PREDNISONE 20 MG PO TABS: 40.0000 mg | ORAL_TABLET | Freq: Every day | ORAL | 0 refills | Status: DC

## 2021-08-11 MED ORDER — AZITHROMYCIN 250 MG PO TABS: ORAL_TABLET | ORAL | 0 refills | Status: DC

## 2021-08-11 MED ORDER — PROMETHAZINE-DM 6.25-15 MG/5ML PO SYRP: 5.0000 mL | ORAL_SOLUTION | Freq: Four times a day (QID) | ORAL | 0 refills | Status: DC | PRN

## 2021-08-11 NOTE — ED Triage Notes (Signed)
Pt presents with cough and congestion xs 4 weeks.

## 2021-08-11 NOTE — ED Provider Notes (Signed)
[facility]    CSN: [id number] Arrival date & time: 08/11/21  1108      History   Chief Complaint Chief Complaint  Patient presents with   Cough   Sore Throat   Nasal Congestion    HPI Carrie Baird is a 45 y.o. female.   Patient resenting today with going on for weeks of hacking productive cough, congestion, chest tightness, fatigue.  States the cough has worsened significantly over the past few days and she is coughing up dark brown phlegm and feeling more short of breath.  She denies chest pain, abdominal pain, nausea vomiting diarrhea, known fevers chills or body aches.  Taking Allegra-D and cough medications with no relief.  No known history of pulmonary disease.  No new sick contacts recently.   History reviewed. No pertinent past medical history.  There are no problems to display for this patient.   History reviewed. No pertinent surgical history.  OB History   No obstetric history on file.      Home Medications    Prior to Admission medications   Medication Sig Start Date End Date Taking? Authorizing Provider  azithromycin (ZITHROMAX) 250 MG tablet Take first 2 tablets together, then 1 every day until finished. 08/11/21  Yes Volney American, PA-C  predniSONE (DELTASONE) 20 MG tablet Take 2 tablets (40 mg total) by mouth daily with breakfast. 08/11/21  Yes Volney American, PA-C  promethazine-dextromethorphan (PROMETHAZINE-DM) 6.25-15 MG/5ML syrup Take 5 mLs by mouth 4 (four) times daily as needed. 08/11/21  Yes Volney American, PA-C  fexofenadine-pseudoephedrine (ALLEGRA-D 24) 180-240 MG 24 hr tablet Take 1 tablet by mouth daily as needed (allergy).    [provider]    Family History History reviewed. No pertinent family history.  Social History Social History   Tobacco Use   Smoking status: Never   Smokeless tobacco: Never  Substance Use Topics   Alcohol use: Yes   Drug use: No     Allergies   Aspirin and  Penicillins   Review of Systems Review of Systems Per HPI  Physical Exam Triage Vital Signs ED Triage Vitals  Enc Vitals Group     BP 08/11/21 1711 129/88     Pulse Rate 08/11/21 1711 78     Resp 08/11/21 1711 16     Temp 08/11/21 1711 97.9 F (36.6 C)     Temp Source 08/11/21 1711 Temporal     SpO2 08/11/21 1711 100 %     Weight --      Height --      Head Circumference --      Peak Flow --      Pain Score 08/11/21 1710 0     Pain Loc --      Pain Edu? --      Excl. in [place]? --    No data found.  Updated Vital Signs BP 129/88 (BP Location: Right Arm)   Pulse 78   Temp 97.9 F (36.6 C) (Temporal)   Resp 16   LMP 08/08/2021   SpO2 100%   Visual Acuity Right Eye Distance:   Left Eye Distance:   Bilateral Distance:    Right Eye Near:   Left Eye Near:    Bilateral Near:     Physical Exam Vitals and nursing note reviewed.  Constitutional:      Appearance: Normal appearance.  HENT:     Head: Atraumatic.     Right Ear: Tympanic membrane and external ear  normal.     Left Ear: Tympanic membrane and external ear normal.     Nose: Congestion present.     Mouth/Throat:     Mouth: Mucous membranes are moist.     Pharynx: Posterior oropharyngeal erythema present.  Eyes:     Extraocular Movements: Extraocular movements intact.     Conjunctiva/sclera: Conjunctivae normal.  Cardiovascular:     Rate and Rhythm: Normal rate and regular rhythm.     Heart sounds: Normal heart sounds.  Pulmonary:     Effort: Pulmonary effort is normal.     Breath sounds: Wheezing present. No rales.     Comments: Minimal scattered wheezes bilaterally Musculoskeletal:        General: Normal range of motion.     Cervical back: Normal range of motion and neck supple.  Skin:    General: Skin is warm and dry.  Neurological:     Mental Status: She is alert and oriented to person, place, and time.  Psychiatric:        Mood and Affect: Mood normal.        Thought Content: Thought content  normal.     UC Treatments / Results  Labs (all labs ordered are listed, but only abnormal results are displayed) Labs Reviewed - No data to display  EKG   Radiology No results found.  Procedures Procedures (including critical care time)  Medications Ordered in UC Medications - No data to display  Initial Impression / Assessment and Plan / UC Course  I have reviewed the triage vital signs and the nursing notes.  Pertinent labs & imaging results that were available during my care of the patient were reviewed by me and considered in my medical decision making (see chart for details).     Vital signs reassuring today with oxygen saturation at 100% on room air.  Given duration and worsening course, will cover with azithromycin and treat for bronchitis with prednisone, Phenergan DM.  Discussed supportive over-the-counter medications and home care.  Continue good allergy regimen.  Return for worsening symptoms.  Final Clinical Impressions(s) / UC Diagnoses   Final diagnoses:  Lower respiratory infection  Seasonal allergic rhinitis due to other allergic trigger   Discharge Instructions   None    ED Prescriptions     Medication Sig Dispense Auth. Provider   azithromycin (ZITHROMAX) 250 MG tablet Take first 2 tablets together, then 1 every day until finished. 6 tablet Volney American, [place]   predniSONE (DELTASONE) 20 MG tablet Take 2 tablets (40 mg total) by mouth daily with breakfast. 10 tablet Volney American, PA-C   promethazine-dextromethorphan (PROMETHAZINE-DM) 6.25-15 MG/5ML syrup Take 5 mLs by mouth 4 (four) times daily as needed. 100 mL Volney American, [place]      PDMP not reviewed this encounter.   Volney American, [place] 08/11/21 1733

## 2022-01-01 ENCOUNTER — Ambulatory Visit (INDEPENDENT_AMBULATORY_CARE_PROVIDER_SITE_OTHER): Payer: 59 | Admitting: Radiology

## 2022-01-01 ENCOUNTER — Encounter: Payer: Self-pay | Admitting: Radiology

## 2022-01-01 VITALS — BP 120/80 | Ht 63.0 in | Wt 153.0 lb

## 2022-01-01 DIAGNOSIS — Z01419 Encounter for gynecological examination (general) (routine) without abnormal findings: Secondary | ICD-10-CM | POA: Diagnosis not present

## 2022-01-01 DIAGNOSIS — Z1211 Encounter for screening for malignant neoplasm of colon: Secondary | ICD-10-CM

## 2022-01-01 NOTE — Progress Notes (Signed)
? ?  Carrie Baird 1976/06/18 [id number] ? ? ?History:  46 y.o. G0 presents for annual exam. ? ?Gynecologic History ?Patient's last menstrual period was 12/11/2021. ?Period Duration (Days): 5 ?Period Pattern: (!) Irregular ?Menstrual Flow: Moderate ?Menstrual Control: Tampon ?Dysmenorrhea: (!) Moderate ?Dysmenorrhea Symptoms: Cramping ?Contraception/Family planning: vasectomy ?Sexually active: yes ?Last Pap: 01/2021. Results were: normal. LEEP 20+ years ago ?Last mammogram: 2022. Results were: normal ? ?Obstetric History ?OB History  ?Gravida Para Term Preterm AB Living  ?0 0 0 0 0 0  ?SAB IAB Ectopic Multiple Live Births  ?0 0 0 0 0  ? ? ? ?The following portions of the patient's history were reviewed and updated as appropriate: allergies, current medications, past family history, past medical history, past social history, past surgical history, and problem list. ? ?Review of Systems ?Pertinent items noted in HPI and remainder of comprehensive ROS otherwise negative.  ? ?Past medical history, past surgical history, family history and social history were all reviewed and documented in the EPIC chart. ? ? ?Exam: ? ?Vitals:  ? 01/01/22 1106  ?BP: 120/80  ?Weight: 153 lb (69.4 kg)  ?Height: 5' 3"  (1.6 m)  ? ?Body mass index is 27.1 kg/m?. ? ?General appearance:  Normal ?Thyroid:  Symmetrical, normal in size, without palpable masses or nodularity. ?Respiratory ? Auscultation:  Clear without wheezing or rhonchi ?Cardiovascular ? Auscultation:  Regular rate, without rubs, murmurs or gallops ? Edema/varicosities:  Not grossly evident ?Abdominal ? Soft,nontender, without masses, guarding or rebound. ? Liver/spleen:  No organomegaly noted ? Hernia:  None appreciated ? Skin ? Inspection:  Grossly normal ?Breasts: Examined lying and sitting.  ? Right: Without masses, retractions, nipple discharge or axillary adenopathy. ? ? Left: Without masses, retractions, nipple discharge or axillary adenopathy. ?Genitourinary  ? Inguinal/mons:   Normal without inguinal adenopathy ? External genitalia:  Normal appearing vulva with no masses, tenderness, or lesions ? BUS/Urethra/Skene's glands:  Normal without masses or exudate ? Vagina:  Normal appearing with normal color and discharge, no lesions ? Cervix:  Normal appearing without discharge or lesions ? Uterus:  Normal in size, shape and contour.  Mobile, nontender ? Adnexa/parametria:   ?  Rt: Normal in size, without masses or tenderness. ?  Lt: Normal in size, without masses or tenderness. ? Anus and perineum: Normal ?  ?Patient informed chaperone available to be present for breast and pelvic exam. Patient has requested no chaperone to be present. Patient has been advised what will be completed during breast and pelvic exam.  ? ?Assessment/Plan:   ?1. Well woman exam with routine gynecological exam ?Pap due 2025 ?Schedule mammo ?Cologuard ordered ?  ? ?Discussed SBE, colonoscopy and DEXA screening as directed/appropriate. Recommend 129mns of exercise weekly, including weight bearing exercise. Encouraged the use of seatbelts and sunscreen. ?Return in 1 year for annual or as needed.  ? ?CKerry DoryWHNP-BC 11:41 AM 01/01/2022  ?

## 2022-01-24 LAB — COLOGUARD: COLOGUARD: NEGATIVE

## 2023-04-21 ENCOUNTER — Ambulatory Visit: Payer: 59 | Admitting: Radiology
